# Patient Record
Sex: Male | Born: 1975 | Race: White | Hispanic: No | Marital: Single | Smoking: Current every day smoker
Health system: Southern US, Community
[De-identification: ages and names within clinical notes are randomized; demographics above are authoritative.]

## PROBLEM LIST (undated history)

## (undated) DIAGNOSIS — M109 Gout, unspecified: Secondary | ICD-10-CM

## (undated) DIAGNOSIS — I10 Essential (primary) hypertension: Secondary | ICD-10-CM

## (undated) HISTORY — DX: Essential (primary) hypertension: I10

## (undated) HISTORY — PX: ANTERIOR CRUCIATE LIGAMENT REPAIR: SHX115

## (undated) HISTORY — DX: Gout, unspecified: M10.9

---

## 2019-03-17 ENCOUNTER — Ambulatory Visit (INDEPENDENT_AMBULATORY_CARE_PROVIDER_SITE_OTHER): Payer: BC Managed Care – PPO

## 2019-03-17 ENCOUNTER — Ambulatory Visit (INDEPENDENT_AMBULATORY_CARE_PROVIDER_SITE_OTHER): Payer: BC Managed Care – PPO | Admitting: Osteopathic Medicine

## 2019-03-17 ENCOUNTER — Encounter: Payer: Self-pay | Admitting: Osteopathic Medicine

## 2019-03-17 ENCOUNTER — Other Ambulatory Visit: Payer: Self-pay

## 2019-03-17 VITALS — BP 143/90 | HR 96 | Temp 98.3°F | Ht 74.0 in | Wt 262.2 lb

## 2019-03-17 DIAGNOSIS — F17200 Nicotine dependence, unspecified, uncomplicated: Secondary | ICD-10-CM

## 2019-03-17 DIAGNOSIS — M79674 Pain in right toe(s): Secondary | ICD-10-CM | POA: Diagnosis not present

## 2019-03-17 DIAGNOSIS — Z8639 Personal history of other endocrine, nutritional and metabolic disease: Secondary | ICD-10-CM | POA: Insufficient documentation

## 2019-03-17 DIAGNOSIS — Z23 Encounter for immunization: Secondary | ICD-10-CM | POA: Diagnosis not present

## 2019-03-17 DIAGNOSIS — M109 Gout, unspecified: Secondary | ICD-10-CM

## 2019-03-17 DIAGNOSIS — I10 Essential (primary) hypertension: Secondary | ICD-10-CM | POA: Insufficient documentation

## 2019-03-17 DIAGNOSIS — L989 Disorder of the skin and subcutaneous tissue, unspecified: Secondary | ICD-10-CM | POA: Insufficient documentation

## 2019-03-17 MED ORDER — COLCHICINE 0.6 MG PO TABS: 0.6000 mg | ORAL_TABLET | Freq: Every day | ORAL | 0 refills | Status: DC

## 2019-03-17 MED ORDER — ALLOPURINOL 300 MG PO TABS: 300.0000 mg | ORAL_TABLET | Freq: Every day | ORAL | 3 refills | Status: DC

## 2019-03-17 MED ORDER — NICOTINE 7 MG/24HR TD PT24: 7.0000 mg | MEDICATED_PATCH | Freq: Every day | TRANSDERMAL | 0 refills | Status: AC

## 2019-03-17 MED ORDER — VALSARTAN 40 MG PO TABS: 40.0000 mg | ORAL_TABLET | Freq: Every day | ORAL | 1 refills | Status: DC

## 2019-03-17 MED ORDER — NICOTINE 14 MG/24HR TD PT24: 14.0000 mg | MEDICATED_PATCH | Freq: Every day | TRANSDERMAL | 0 refills | Status: AC

## 2019-03-17 MED ORDER — NICOTINE 21 MG/24HR TD PT24: 21.0000 mg | MEDICATED_PATCH | Freq: Every day | TRANSDERMAL | 0 refills | Status: AC

## 2019-03-17 NOTE — Progress Notes (Signed)
HPI: Gabriel Benton is a 43 y.o. male who  has a past medical history of Gout and Hypertension.  he presents to [facility] today, 03/17/19,  for chief complaint of: New to establish  Gout  HTN Skin check  Toe injury   Off all Rx meds for about 4 mos  Was on something for BP can't remember what Rx.   Off allopurinol for about 4 mos  Was also on something prn gout flare (?colchicine?)   Injury R 5th toe, concerned about break, still sore, stubbed it a month ago. R lateral foot also painful.   Concern about mole on face. Seems to be getting larger.   No FH colon cancer or prostate cancer, declines flu shots, updated Tdap today. Cisgender heterosexual male, declines STI screening, monogamous.    Past medical, surgical, social and family history reviewed:  Patient Active Problem List   Diagnosis Date Noted  . Gout 03/17/2019  . Essential hypertension 03/17/2019  . Smoker 03/17/2019  . Toe pain, right 03/17/2019  . Skin lesion 03/17/2019  . History of elevated glucose 03/17/2019    Past Surgical History:  Procedure Laterality Date  . ANTERIOR CRUCIATE LIGAMENT REPAIR      Social History   Tobacco Use  . Smoking status: Current Every Day Smoker    Packs/day: 1.00    Years: 20.00    Pack years: 20.00    Types: Cigarettes  . Smokeless tobacco: Never Used  Substance Use Topics  . Alcohol use: Yes    Alcohol/week: 6.0 standard drinks    Types: 6 Standard drinks or equivalent per week    Family History  Problem Relation Age of Onset  . Kidney cancer Mother   . High blood pressure Father   . Diabetes Father      Current medication list and allergy/intolerance information reviewed:    Current Outpatient Medications  Medication Sig Dispense Refill  . allopurinol (ZYLOPRIM) 300 MG tablet Take 1 tablet (300 mg total) by mouth daily. 90 tablet 3  . colchicine 0.6 MG tablet Take 1 tablet (0.6 mg total) by mouth daily. 90 tablet 0  .  nicotine (NICODERM CQ - DOSED IN MG/24 HOURS) 14 mg/24hr patch Place 1 patch (14 mg total) onto the skin daily. 28 patch 0  . nicotine (NICODERM CQ - DOSED IN MG/24 HOURS) 21 mg/24hr patch Place 1 patch (21 mg total) onto the skin daily. 28 patch 0  . nicotine (NICODERM CQ - DOSED IN MG/24 HR) 7 mg/24hr patch Place 1 patch (7 mg total) onto the skin daily. 28 patch 0  . valsartan (DIOVAN) 40 MG tablet Take 1 tablet (40 mg total) by mouth daily. 30 tablet 1   No current facility-administered medications for this visit.     No Known Allergies    Review of Systems:  Constitutional:  No  fever, no chills, No recent illness, No unintentional weight changes. No significant fatigue.   HEENT: No  headache, no vision change, no hearing change, No sore throat, No  sinus pressure  Cardiac: No  chest pain, No  pressure, No palpitations, No  Orthopnea  Respiratory:  No  shortness of breath. No  Cough  Gastrointestinal: No  abdominal pain, No  nausea, No  vomiting,  No  blood in stool, No  diarrhea, No  constipation   Musculoskeletal: +new myalgia/arthralgia  Skin: No  Rash, +other wounds/concerning lesions  Genitourinary: No  incontinence, No  abnormal genital bleeding, No abnormal  genital discharge  Hem/Onc: No  easy bruising/bleeding, No  abnormal lymph node  Endocrine: No cold intolerance,  No heat intolerance. No polyuria/polydipsia/polyphagia   Neurologic: No  weakness, No  dizziness, No  slurred speech/focal weakness/facial droop  Psychiatric: No  concerns with depression, No  concerns with anxiety, No sleep problems, No mood problems  Exam:  BP (!) 143/90 (BP Location: Left Arm, Patient Position: Sitting, Cuff Size: Large)   Pulse 96   Temp 98.3 F (36.8 C) (Oral)   Ht  (1.88 m)   Wt 262 lb 3.2 oz (118.9 kg)   BMI 33.66 kg/m 0  Constitutional: VS see above. General Appearance: alert, well-developed, well-nourished, NAD  Eyes: Normal lids and conjunctive, non-icteric  sclera  Ears, Nose, Mouth, Throat: MMM, Normal external inspection ears/nares/mouth/lips/gums. TM normal bilaterally. Pharynx/tonsils no erythema, no exudate. Nasal mucosa normal.   Neck: No masses, trachea midline. No thyroid enlargement. No tenderness/mass appreciated. No lymphadenopathy  Respiratory: Normal respiratory effort. no wheeze, no rhonchi, no rales  Cardiovascular: S1/S2 normal, no murmur, no rub/gallop auscultated. RRR. No lower extremity edema.  Gastrointestinal: Nontender, no masses. No hepatomegaly, no splenomegaly. No hernia appreciated. Bowel sounds normal. Rectal exam deferred.   Musculoskeletal: Gait normal. No clubbing/cyanosis of digits.  Right fifth toe appears a bit swollen, no ecchymoses or edema.  Neurological: Normal balance/coordination. No tremor. No cranial nerve deficit on limited exam. Motor and sensation intact and symmetric. Cerebellar reflexes intact.   Skin: warm, dry, intact. No rash/ulcer.  Several seborrheic keratoses on the face, one on right side of the lower face cauliflower-like appearance, at least a centimeter diameter, no significant discoloration  Psychiatric: Normal judgment/insight. Normal mood and affect. Oriented x3.    No results found for this or any previous visit (from the past 72 hour(s)).  No results found.   ASSESSMENT/PLAN: The primary encounter diagnosis was Essential hypertension. Diagnoses of Need for Tdap vaccination, Gout, unspecified cause, unspecified chronicity, unspecified site, Smoker, Toe pain, right, Skin lesion, and History of elevated glucose were also pertinent to this visit.   Orders Placed This Encounter  Procedures  . DG Foot Complete Right  . Tdap vaccine greater than or equal to 7yo IM  . CBC  . COMPLETE METABOLIC PANEL WITH GFR  . Lipid panel  . Hemoglobin A1c  . Uric acid  . Ambulatory referral to Dermatology    Meds ordered this encounter  Medications  . allopurinol (ZYLOPRIM) 300 MG tablet     Sig: Take 1 tablet (300 mg total) by mouth daily.    Dispense:  90 tablet    Refill:  3  . colchicine 0.6 MG tablet    Sig: Take 1 tablet (0.6 mg total) by mouth daily.    Dispense:  90 tablet    Refill:  0  . valsartan (DIOVAN) 40 MG tablet    Sig: Take 1 tablet (40 mg total) by mouth daily.    Dispense:  30 tablet    Refill:  1  . nicotine (NICODERM CQ - DOSED IN MG/24 HR) 7 mg/24hr patch    Sig: Place 1 patch (7 mg total) onto the skin daily.    Dispense:  28 patch    Refill:  0  . nicotine (NICODERM CQ - DOSED IN MG/24 HOURS) 14 mg/24hr patch    Sig: Place 1 patch (14 mg total) onto the skin daily.    Dispense:  28 patch    Refill:  0  . nicotine (NICODERM CQ -  DOSED IN MG/24 HOURS) 21 mg/24hr patch    Sig: Place 1 patch (21 mg total) onto the skin daily.    Dispense:  28 patch    Refill:  0    Patient Instructions  Plan:  Labs today.   X-ray of the foot today, will run it by sports medicine to see if anything else needs to be done about it or if you might need to see a podiatrist.  We will send referral to dermatologist, the spot of skin on the face appears benign to me but if we wanted this removed a dermatologist would probably be the best person to do it.  We will restart medications for blood pressure and for gout.  Also send a prescription for nicotine patches.  See below for other information on quitting smoking.  Start with a higher dose patch and decreased to the lowest dose patch and then discontinue patches, should be off of cigarettes by that point!        Steps to Quit Smoking  Smoking tobacco can be harmful to your health and can affect almost every organ in your body. Smoking puts you, and those around you, at risk for developing many serious chronic diseases. Quitting smoking is difficult, but it is one of the best things that you can do for your health. It is never too late to quit. What are the benefits of quitting smoking? When you quit  smoking, you lower your risk of developing serious diseases and conditions, such as:  Lung cancer or lung disease, such as COPD.  Heart disease.  Stroke.  Heart attack.  Infertility.  Osteoporosis and bone fractures. Additionally, symptoms such as coughing, wheezing, and shortness of breath may get better when you quit. You may also find that you get sick less often because your body is stronger at fighting off colds and infections. If you are pregnant, quitting smoking can help to reduce your chances of having a baby of low birth weight. How do I get ready to quit? When you decide to quit smoking, create a plan to make sure that you are successful. Before you quit:  Pick a date to quit. Set a date within the next two weeks to give you time to prepare.  Write down the reasons why you are quitting. Keep this list in places where you will see it often, such as on your bathroom mirror or in your car or wallet.  Identify the people, places, things, and activities that make you want to smoke (triggers) and avoid them. Make sure to take these actions: ? Throw away all cigarettes at home, at work, and in your car. ? Throw away smoking accessories, such as Set designerashtrays and lighters. ? Clean your car and make sure to empty the ashtray. ? Clean your home, including curtains and carpets.  Tell your family, friends, and coworkers that you are quitting. Support from your loved ones can make quitting easier.  Talk with your health care provider about your options for quitting smoking.  Find out what treatment options are covered by your health insurance. What strategies can I use to quit smoking? Talk with your healthcare provider about different strategies to quit smoking. Some strategies include:  Quitting smoking altogether instead of gradually lessening how much you smoke over a period of time. Research shows that quitting "cold [place]" is more successful than gradually quitting.  Attending  in-person counseling to help you build problem-solving skills. You are more likely to have success in  quitting if you attend several counseling sessions. Even short sessions of 10 minutes can be effective.  Finding resources and support systems that can help you to quit smoking and remain smoke-free after you quit. These resources are most helpful when you use them often. They can include: ? Online chats with a Social worker. ? Telephone quitlines. ? Careers information officer. ? Support groups or group counseling. ? Text messaging programs. ? Mobile phone applications.  Taking medicines to help you quit smoking. (If you are pregnant or breastfeeding, talk with your health care provider first.) Some medicines contain nicotine and some do not. Both types of medicines help with cravings, but the medicines that include nicotine help to relieve withdrawal symptoms. Your health care provider may recommend: ? Nicotine patches, gum, or lozenges. ? Nicotine inhalers or sprays. ? Non-nicotine medicine that is taken by mouth. Talk with your health care provider about combining strategies, such as taking medicines while you are also receiving in-person counseling. Using these two strategies together makes you more likely to succeed in quitting than if you used either strategy on its own. If you are pregnant or breastfeeding, talk with your health care provider about finding counseling or other support strategies to quit smoking. Do not take medicine to help you quit smoking unless told to do so by your health care provider. What things can I do to make it easier to quit? Quitting smoking might feel overwhelming at first, but there is a lot that you can do to make it easier. Take these important actions:  Reach out to your family and friends and ask that they support and encourage you during this time. Call telephone quitlines, reach out to support groups, or work with a counselor for support.  Ask people who  smoke to avoid smoking around you.  Avoid places that trigger you to smoke, such as bars, parties, or smoke-break areas at work.  Spend time around people who do not smoke.  Lessen stress in your life, because stress can be a smoking trigger for some people. To lessen stress, try: ? Exercising regularly. ? Deep-breathing exercises. ? Yoga. ? Meditating. ? Performing a body scan. This involves closing your eyes, scanning your body from head to toe, and noticing which parts of your body are particularly tense. Purposefully relax the muscles in those areas.  Download or purchase mobile phone or tablet apps (applications) that can help you stick to your quit plan by providing reminders, tips, and encouragement. There are many free apps, such as QuitGuide from the [employer] Office manager for Disease Control and Prevention). You can find other support for quitting smoking (smoking cessation) through smokefree.gov and other websites. How will I feel when I quit smoking? Within the first 24 hours of quitting smoking, you may start to feel some withdrawal symptoms. These symptoms are usually most noticeable 2-3 days after quitting, but they usually do not last beyond 2-3 weeks. Changes or symptoms that you might experience include:  Mood swings.  Restlessness, anxiety, or irritation.  Difficulty concentrating.  Dizziness.  Strong cravings for sugary foods in addition to nicotine.  Mild weight gain.  Constipation.  Nausea.  Coughing or a sore throat.  Changes in how your medicines work in your body.  A depressed mood.  Difficulty sleeping (insomnia). After the first 2-3 weeks of quitting, you may start to notice more positive results, such as:  Improved sense of smell and taste.  Decreased coughing and sore throat.  Slower heart rate.  Lower blood  pressure.  Clearer skin.  The ability to breathe more easily.  Fewer sick days. Quitting smoking is very challenging for most people. Do  not get discouraged if you are not successful the first time. Some people need to make many attempts to quit before they achieve long-term success. Do your best to stick to your quit plan, and talk with your health care provider if you have any questions or concerns. This information is not intended to replace advice given to you by your health care provider. Make sure you discuss any questions you have with your health care provider. Document Released: 09/18/2001 Document Revised: 04/30/2017 Document Reviewed: 02/08/2015 Elsevier Interactive Patient Education  2019 [employer]            Visit summary with medication list and pertinent instructions was printed for patient to review. All questions at time of visit were answered - patient instructed to contact office with any additional concerns or updates. ER/RTC precautions were reviewed with the patient.     Please note: voice recognition software was used to produce this document, and typos may escape review. Please contact Dr. Lyn HollingsheadAlexander for any needed clarifications.     Follow-up plan: Return in about 2 weeks (around 03/31/2019) for blood pressure recheck on new medications, review lab results.  .Marland Kitchen

## 2019-03-17 NOTE — Patient Instructions (Addendum)
Plan:  Labs today.   X-ray of the foot today, will run it by sports medicine to see if anything else needs to be done about it or if you might need to see a podiatrist.  We will send referral to dermatologist, the spot of skin on the face appears benign to me but if we wanted this removed a dermatologist would probably be the best person to do it.  We will restart medications for blood pressure and for gout.  Also send a prescription for nicotine patches.  See below for other information on quitting smoking.  Start with a higher dose patch and decreased to the lowest dose patch and then discontinue patches, should be off of cigarettes by that point!        Steps to Quit Smoking  Smoking tobacco can be harmful to your health and can affect almost every organ in your body. Smoking puts you, and those around you, at risk for developing many serious chronic diseases. Quitting smoking is difficult, but it is one of the best things that you can do for your health. It is never too late to quit. What are the benefits of quitting smoking? When you quit smoking, you lower your risk of developing serious diseases and conditions, such as:  Lung cancer or lung disease, such as COPD.  Heart disease.  Stroke.  Heart attack.  Infertility.  Osteoporosis and bone fractures. Additionally, symptoms such as coughing, wheezing, and shortness of breath may get better when you quit. You may also find that you get sick less often because your body is stronger at fighting off colds and infections. If you are pregnant, quitting smoking can help to reduce your chances of having a baby of low birth weight. How do I get ready to quit? When you decide to quit smoking, create a plan to make sure that you are successful. Before you quit:  Pick a date to quit. Set a date within the next two weeks to give you time to prepare.  Write down the reasons why you are quitting. Keep this list in places where you will  see it often, such as on your bathroom mirror or in your car or wallet.  Identify the people, places, things, and activities that make you want to smoke (triggers) and avoid them. Make sure to take these actions: ? Throw away all cigarettes at home, at work, and in your car. ? Throw away smoking accessories, such as Scientist, research (medical). ? Clean your car and make sure to empty the ashtray. ? Clean your home, including curtains and carpets.  Tell your family, friends, and coworkers that you are quitting. Support from your loved ones can make quitting easier.  Talk with your health care provider about your options for quitting smoking.  Find out what treatment options are covered by your health insurance. What strategies can I use to quit smoking? Talk with your healthcare provider about different strategies to quit smoking. Some strategies include:  Quitting smoking altogether instead of gradually lessening how much you smoke over a period of time. Research shows that quitting "cold [place]" is more successful than gradually quitting.  Attending in-person counseling to help you build problem-solving skills. You are more likely to have success in quitting if you attend several counseling sessions. Even short sessions of 10 minutes can be effective.  Finding resources and support systems that can help you to quit smoking and remain smoke-free after you quit. These resources are most helpful when  you use them often. They can include: ? Online chats with a Veterinary surgeoncounselor. ? Telephone quitlines. ? Automotive engineerrinted self-help materials. ? Support groups or group counseling. ? Text messaging programs. ? Mobile phone applications.  Taking medicines to help you quit smoking. (If you are pregnant or breastfeeding, talk with your health care provider first.) Some medicines contain nicotine and some do not. Both types of medicines help with cravings, but the medicines that include nicotine help to relieve withdrawal  symptoms. Your health care provider may recommend: ? Nicotine patches, gum, or lozenges. ? Nicotine inhalers or sprays. ? Non-nicotine medicine that is taken by mouth. Talk with your health care provider about combining strategies, such as taking medicines while you are also receiving in-person counseling. Using these two strategies together makes you more likely to succeed in quitting than if you used either strategy on its own. If you are pregnant or breastfeeding, talk with your health care provider about finding counseling or other support strategies to quit smoking. Do not take medicine to help you quit smoking unless told to do so by your health care provider. What things can I do to make it easier to quit? Quitting smoking might feel overwhelming at first, but there is a lot that you can do to make it easier. Take these important actions:  Reach out to your family and friends and ask that they support and encourage you during this time. Call telephone quitlines, reach out to support groups, or work with a counselor for support.  Ask people who smoke to avoid smoking around you.  Avoid places that trigger you to smoke, such as bars, parties, or smoke-break areas at work.  Spend time around people who do not smoke.  Lessen stress in your life, because stress can be a smoking trigger for some people. To lessen stress, try: ? Exercising regularly. ? Deep-breathing exercises. ? Yoga. ? Meditating. ? Performing a body scan. This involves closing your eyes, scanning your body from head to toe, and noticing which parts of your body are particularly tense. Purposefully relax the muscles in those areas.  Download or purchase mobile phone or tablet apps (applications) that can help you stick to your quit plan by providing reminders, tips, and encouragement. There are many free apps, such as QuitGuide from the [employer] Systems developer(Centers for Disease Control and Prevention). You can find other support for  quitting smoking (smoking cessation) through smokefree.gov and other websites. How will I feel when I quit smoking? Within the first 24 hours of quitting smoking, you may start to feel some withdrawal symptoms. These symptoms are usually most noticeable 2-3 days after quitting, but they usually do not last beyond 2-3 weeks. Changes or symptoms that you might experience include:  Mood swings.  Restlessness, anxiety, or irritation.  Difficulty concentrating.  Dizziness.  Strong cravings for sugary foods in addition to nicotine.  Mild weight gain.  Constipation.  Nausea.  Coughing or a sore throat.  Changes in how your medicines work in your body.  A depressed mood.  Difficulty sleeping (insomnia). After the first 2-3 weeks of quitting, you may start to notice more positive results, such as:  Improved sense of smell and taste.  Decreased coughing and sore throat.  Slower heart rate.  Lower blood pressure.  Clearer skin.  The ability to breathe more easily.  Fewer sick days. Quitting smoking is very challenging for most people. Do not get discouraged if you are not successful the first time. Some people need to make  many attempts to quit before they achieve long-term success. Do your best to stick to your quit plan, and talk with your health care provider if you have any questions or concerns. This information is not intended to replace advice given to you by your health care provider. Make sure you discuss any questions you have with your health care provider. Document Released: 09/18/2001 Document Revised: 04/30/2017 Document Reviewed: 02/08/2015 Elsevier Interactive Patient Education  2019 [employer]

## 2019-03-18 LAB — HEMOGLOBIN A1C
Hgb A1c MFr Bld: 5.2 % of total Hgb (ref <5.7)
Mean Plasma Glucose: 103 (calc)
eAG (mmol/L): 5.7 (calc)

## 2019-03-18 LAB — CBC
HCT: 51.6 % — ABNORMAL HIGH (ref 38.5–50.0)
Hemoglobin: 17.5 g/dL — ABNORMAL HIGH (ref 13.2–17.1)
MCH: 33 pg (ref 27.0–33.0)
MCHC: 33.9 g/dL (ref 32.0–36.0)
MCV: 97.2 fL (ref 80.0–100.0)
MPV: 10.6 fL (ref 7.5–12.5)
Platelets: 223 10*3/uL (ref 140–400)
RBC: 5.31 10*6/uL (ref 4.20–5.80)
RDW: 13.4 % (ref 11.0–15.0)
WBC: 9.7 10*3/uL (ref 3.8–10.8)

## 2019-03-18 LAB — COMPLETE METABOLIC PANEL WITH GFR
AG Ratio: 1.7 (calc) (ref 1.0–2.5)
ALT: 17 U/L (ref 9–46)
AST: 16 U/L (ref 10–40)
Albumin: 4.2 g/dL (ref 3.6–5.1)
Alkaline phosphatase (APISO): 73 U/L (ref 36–130)
BUN: 10 mg/dL (ref 7–25)
CO2: 26 mmol/L (ref 20–32)
Calcium: 9.2 mg/dL (ref 8.6–10.3)
Chloride: 106 mmol/L (ref 98–110)
Creat: 0.78 mg/dL (ref 0.60–1.35)
GFR, Est African American: 128 mL/min/{1.73_m2} (ref >=60)
GFR, Est Non African American: 111 mL/min/{1.73_m2} (ref >=60)
Globulin: 2.5 g/dL (calc) (ref 1.9–3.7)
Glucose, Bld: 107 mg/dL — ABNORMAL HIGH (ref 65–99)
Potassium: 4.8 mmol/L (ref 3.5–5.3)
Sodium: 141 mmol/L (ref 135–146)
Total Bilirubin: 0.5 mg/dL (ref 0.2–1.2)
Total Protein: 6.7 g/dL (ref 6.1–8.1)

## 2019-03-18 LAB — LIPID PANEL
Cholesterol: 218 mg/dL — ABNORMAL HIGH (ref <200)
HDL: 49 mg/dL (ref >=40)
LDL Cholesterol (Calc): 137 mg/dL (calc) — ABNORMAL HIGH
Non-HDL Cholesterol (Calc): 169 mg/dL (calc) — ABNORMAL HIGH (ref <130)
Total CHOL/HDL Ratio: 4.4 (calc) (ref <5.0)
Triglycerides: 180 mg/dL — ABNORMAL HIGH (ref <150)

## 2019-03-18 LAB — URIC ACID: Uric Acid, Serum: 8.2 mg/dL — ABNORMAL HIGH (ref 4.0–8.0)

## 2019-03-31 ENCOUNTER — Encounter: Payer: Self-pay | Admitting: Osteopathic Medicine

## 2019-03-31 ENCOUNTER — Ambulatory Visit (INDEPENDENT_AMBULATORY_CARE_PROVIDER_SITE_OTHER): Payer: BC Managed Care – PPO | Admitting: Osteopathic Medicine

## 2019-03-31 VITALS — BP 136/88 | HR 94 | Temp 98.5°F | Wt 269.6 lb

## 2019-03-31 DIAGNOSIS — M109 Gout, unspecified: Secondary | ICD-10-CM

## 2019-03-31 DIAGNOSIS — I10 Essential (primary) hypertension: Secondary | ICD-10-CM | POA: Diagnosis not present

## 2019-03-31 MED ORDER — VALSARTAN 40 MG PO TABS: 40.0000 mg | ORAL_TABLET | Freq: Every day | ORAL | 1 refills | Status: DC

## 2019-03-31 NOTE — Progress Notes (Signed)
HPI: Gabriel Benton is a 43 y.o. male who  has a past medical history of Gout and Hypertension.  [facility], 03/31/19,  for chief complaint of:  Recheck BP   Seen in office 03/17/19 (2 weeks ago) to establish care.  Doing well on new meds.  Spent some time reviewing recent lab results.    BP Readings from Last 3 Encounters:  03/31/19 136/88  03/17/19 (!) 143/90     Wt Readings from Last 3 Encounters:  03/31/19 269 lb 9.6 oz (122.3 kg)  03/17/19 262 lb 3.2 oz (118.9 kg)     At today's visit 03/31/19 ... PMH, PSH, FH reviewed and updated as needed.  Current medication list and allergy/intolerance hx reviewed and updated as needed. (See remainder of HPI, ROS, Phys Exam below)   No results found.  No results found for this or any previous visit (from the past 72 hour(s)).        ASSESSMENT/PLAN: The primary encounter diagnosis was Essential hypertension. A diagnosis of Gout, unspecified cause, unspecified chronicity, unspecified site was also pertinent to this visit.   Work on [employer], low salt diet, I think ok to leave meds for now but would increase to get to goal 130/80 is lifestyle measures aren't getting us there. Pt will be checking BP at home.   The 10-year ASCVD risk score Denman George(Goff [place] Montez HagemanJr., et al., 2013) is: 7.3%   Values used to calculate the score:     Age: [age] years     Sex: Male     Is Non-Hispanic African American: No     Diabetic: No     Tobacco smoker: Yes     Systolic Blood Pressure: 136 mmHg     Is BP treated: Yes     HDL Cholesterol: 49 mg/dL     Total Cholesterol: 218 mg/dL  Orders Placed This Encounter  Procedures  . BASIC METABOLIC PANEL WITH GFR  . Uric acid     Meds ordered this encounter  Medications  . valsartan (DIOVAN) 40 MG tablet    Sig: Take 1 tablet (40 mg total) by mouth daily.    Dispense:  90 tablet    Refill:  1    Cancel 30 days thanks        Follow-up plan: Return  in about 3 months (around 07/01/2019) for BP recheck w/ Dr A, in the meantime lab visit only in about a month (04/30/19) .                                                 ################################################# ################################################# ################################################# #################################################    Current Meds  Medication Sig  . allopurinol (ZYLOPRIM) 300 MG tablet Take 1 tablet (300 mg total) by mouth daily.  . colchicine 0.6 MG tablet Take 1 tablet (0.6 mg total) by mouth daily.  Marland Kitchen. MINOCYCLINE HCL PO Take by mouth.  . nicotine (NICODERM CQ - DOSED IN MG/24 HOURS) 14 mg/24hr patch Place 1 patch (14 mg total) onto the skin daily.  . nicotine (NICODERM CQ - DOSED IN MG/24 HOURS) 21 mg/24hr patch Place 1 patch (21 mg total) onto the skin daily.  . nicotine (NICODERM CQ - DOSED IN MG/24 HR) 7 mg/24hr patch Place 1 patch (7 mg total) onto the skin daily.  . valsartan (DIOVAN) 40  MG tablet Take 1 tablet (40 mg total) by mouth daily.  . [DISCONTINUED] valsartan (DIOVAN) 40 MG tablet Take 1 tablet (40 mg total) by mouth daily.    Allergies  Allergen Reactions  . Naproxen Nausea Only       Review of Systems:  Constitutional: No recent illness  HEENT: No  headache, no vision change  Cardiac: No  chest pain, No  pressure, No palpitations  Respiratory:  No  shortness of breath. No  Cough  Musculoskeletal: No new myalgia/arthralgia, toe still hurts  Neurologic: No  weakness, No  Dizziness  Psychiatric: No  concerns with depression, No  concerns with anxiety  Exam:  BP 136/88 (BP Location: Left Arm, Patient Position: Sitting, Cuff Size: Large)   Pulse 94   Temp 98.5 F (36.9 C) (Oral)   Wt 269 lb 9.6 oz (122.3 kg)   BMI 34.61 kg/m   Constitutional: VS see above. General Appearance: alert, well-developed, well-nourished, NAD  Eyes: Normal lids and  conjunctive, non-icteric sclera  Ears, Nose, Mouth, Throat: MMM, Normal external inspection ears/nares/mouth/lips/gums.  Neck: No masses, trachea midline.   Respiratory: Normal respiratory effort.   Musculoskeletal: Gait normal. Symmetric and independent movement of all extremities  Neurological: Normal balance/coordination. No tremor.  Skin: warm, dry, intact.   Psychiatric: Normal judgment/insight. Normal mood and affect. Oriented x3.       Visit summary with medication list and pertinent instructions was printed for patient to review, patient was advised to alert [place] if any updates are needed. All questions at time of visit were answered - patient instructed to contact office with any additional concerns. ER/RTC precautions were reviewed with the patient and understanding verbalized.      Please note: voice recognition software was used to produce this document, and typos may escape review. Please contact Dr. Sheppard Coil for any needed clarifications.    Follow up plan: Return in about 3 months (around 07/01/2019) for BP recheck w/ Dr A, in the meantime lab visit only in about a month (04/30/19) .

## 2019-04-17 ENCOUNTER — Encounter: Payer: Self-pay | Admitting: Osteopathic Medicine

## 2019-04-20 ENCOUNTER — Other Ambulatory Visit: Payer: Self-pay | Admitting: Osteopathic Medicine

## 2019-04-20 MED ORDER — IRBESARTAN 75 MG PO TABS: 75.0000 mg | ORAL_TABLET | Freq: Every day | ORAL | 0 refills | Status: DC

## 2019-05-11 ENCOUNTER — Other Ambulatory Visit: Payer: Self-pay | Admitting: Osteopathic Medicine

## 2019-06-23 ENCOUNTER — Telehealth: Payer: Self-pay

## 2019-06-23 ENCOUNTER — Other Ambulatory Visit: Payer: Self-pay

## 2019-06-23 ENCOUNTER — Encounter: Payer: Self-pay | Admitting: Osteopathic Medicine

## 2019-06-23 MED ORDER — ALLOPURINOL 300 MG PO TABS: 300.0000 mg | ORAL_TABLET | Freq: Every day | ORAL | 1 refills | Status: DC

## 2019-06-23 MED ORDER — COLCHICINE 0.6 MG PO TABS: 0.6000 mg | ORAL_TABLET | Freq: Every day | ORAL | 1 refills | Status: DC

## 2019-06-23 MED ORDER — IRBESARTAN 75 MG PO TABS: 75.0000 mg | ORAL_TABLET | Freq: Every day | ORAL | 1 refills | Status: DC

## 2019-06-23 NOTE — Telephone Encounter (Signed)
Pt sent msg via mychart.  He wants his scripts transferred to [facility] on [address] mainly the Allopurinol. He also uploaded his new insurance card via [place] but I don't see it.. I sent him a message.

## 2019-06-23 NOTE — Telephone Encounter (Signed)
Thanks [place].

## 2019-06-23 NOTE — Telephone Encounter (Signed)
Task completed. Rxs cancelled at [place]. At pt's request remaining refills for irbersartan, allopurinol and colchicine [facility] [place]. Pt has been updated. No other inquiries during call.

## 2019-07-01 ENCOUNTER — Ambulatory Visit: Payer: BC Managed Care – PPO | Admitting: Osteopathic Medicine

## 2019-07-01 ENCOUNTER — Encounter: Payer: Self-pay | Admitting: Osteopathic Medicine

## 2019-07-15 ENCOUNTER — Encounter: Payer: Self-pay | Admitting: Osteopathic Medicine

## 2019-07-15 ENCOUNTER — Other Ambulatory Visit: Payer: Self-pay

## 2019-07-15 ENCOUNTER — Ambulatory Visit (INDEPENDENT_AMBULATORY_CARE_PROVIDER_SITE_OTHER): Payer: No Typology Code available for payment source | Admitting: Osteopathic Medicine

## 2019-07-15 VITALS — BP 124/85 | HR 102 | Temp 98.7°F | Wt 300.0 lb

## 2019-07-15 DIAGNOSIS — F17200 Nicotine dependence, unspecified, uncomplicated: Secondary | ICD-10-CM

## 2019-07-15 DIAGNOSIS — I10 Essential (primary) hypertension: Secondary | ICD-10-CM | POA: Diagnosis not present

## 2019-07-15 DIAGNOSIS — M109 Gout, unspecified: Secondary | ICD-10-CM

## 2019-07-15 DIAGNOSIS — S91209A Unspecified open wound of unspecified toe(s) with damage to nail, initial encounter: Secondary | ICD-10-CM

## 2019-07-15 MED ORDER — VALSARTAN 40 MG PO TABS: 40.0000 mg | ORAL_TABLET | Freq: Every day | ORAL | 1 refills | Status: DC

## 2019-07-15 NOTE — Patient Instructions (Addendum)
STOP irbesartan RESTART valsartan  Nurse visit - bring home BP monitor so we can verify accuracy!  Goal BP 130/80 or less!   Let me know if shoulder/neck becomes an issue again  Let me know if that toenail starts to look nasty!   Will check labs for gout levels, kidney function for medication safety

## 2019-07-15 NOTE — Progress Notes (Signed)
HPI: Gabriel Benton is a 43 y.o. male who  has a past medical history of Gout and Hypertension.  he presents to [facility] today, 07/15/19,  for chief complaint of:  Follow up BP Shoulder issue  Shoulder: See note 07/01/19, concern about shoulder pain, offered appt w/ sports but pt read message didn't call office to schedule.  Patient reports the issue is resolved at this point, he was having sharp pains in the left side behind/under the shoulder blade at base of neck, radiating into shoulder and was experiencing some numbness/tingling in the first and second digits of the left hand.  Thinks it was due to sleeping on a different mattress while on vacation.  Symptoms have not recurred.  BP: last in office visit 03/2019 we discussed BP, decided he would work on weight, low salt diet, "I think ok to leave meds for now but would increase to get to goal 130/80 is lifestyle measures aren't getting [place] there. Pt will be checking BP at home." Valsartan 40 mg was out at the pharmacy so I sent Irbesartan 75 mg.  He states that on the irbesartan, he has noticed a significant decrease in libido.  No chest pain, pressure, headache, dizziness.  He would like to try getting back on the valsartan, he did not have this effect with that medication.  He is checking blood pressures at home but is getting numbers systolic up into the 170s, blood pressure cuff has not been verified in the office  BP Readings from Last 3 Encounters:  07/15/19 (!) 146/91 on intake,  Recheck 124/85  03/31/19 136/88  03/17/19 (!) 143/90    Toenail left great toe, stubbed this and there is been a large portion of it but is just kind of hanging on, he has been nervous to pull it.    At today's visit 07/15/19 ... PMH, PSH, FH reviewed and updated as needed.  Current medication list and allergy/intolerance hx reviewed and updated as needed. (See remainder of HPI, ROS, Phys Exam below)   No results  found.  No results found for this or any previous visit (from the past 72 hour(s)).        ASSESSMENT/PLAN: The primary encounter diagnosis was Essential hypertension. Diagnoses of Gout, unspecified cause, unspecified chronicity, unspecified site, Smoker, and Toenail avulsion, initial encounter were also pertinent to this visit.   I remove the avulsed portion of toenail, I do not see any ingrowing/pain nail on that left side of the left toe, on the right side of that toenail there is some significant onychomycosis and slight ingrowing but no apparent infection, patient states that this does not bother him  No orders of the defined types were placed in this encounter.    Meds ordered this encounter  Medications  . valsartan (DIOVAN) 40 MG tablet    Sig: Take 1 tablet (40 mg total) by mouth daily.    Dispense:  90 tablet    Refill:  1    Patient Instructions  STOP irbesartan RESTART valsartan  Nurse visit - bring home BP monitor so we can verify accuracy!  Goal BP 130/80 or less!   Let me know if shoulder/neck becomes an issue again  Let me know if that toenail starts to look nasty!   Will check labs for gout levels, kidney function for medication safety         Follow-up plan: Return for nurse visit verify home BP monitor .                                                 ################################################# ################################################# ################################################# #################################################  Current Meds  Medication Sig  . allopurinol (ZYLOPRIM) 300 MG tablet Take 1 tablet (300 mg total) by mouth daily.  . irbesartan (AVAPRO) 75 MG tablet Take 1 tablet (75 mg total) by mouth daily.  Marland Kitchen MINOCYCLINE HCL PO Take by mouth.  . nicotine (NICODERM CQ - DOSED IN MG/24 HOURS) 14 mg/24hr patch Place 1 patch (14 mg total) onto the skin daily.  .  nicotine (NICODERM CQ - DOSED IN MG/24 HOURS) 21 mg/24hr patch Place 1 patch (21 mg total) onto the skin daily.  . nicotine (NICODERM CQ - DOSED IN MG/24 HR) 7 mg/24hr patch Place 1 patch (7 mg total) onto the skin daily.    Allergies  Allergen Reactions  . Naproxen Nausea Only       Review of Systems:  Constitutional: No recent illness  HEENT: No  headache, no vision change  Cardiac: No  chest pain, No  pressure, No palpitations  Respiratory:  No  shortness of breath. No  Cough  Gastrointestinal: No  abdominal pain,  Musculoskeletal: No new myalgia/arthralgia  Skin/nails: No  Rash, toenail issue as per HPI  Neurologic: No  weakness, No  Dizziness  Psychiatric: No  concerns with depression, No  concerns with anxiety  Exam:  BP (!) 146/91 (BP Location: Left Arm, Patient Position: Sitting, Cuff Size: Large)   Pulse 100   Temp 98.7 F (37.1 C) (Oral)   Wt 300 lb (136.1 kg)   BMI 38.52 kg/m   Constitutional: VS see above. General Appearance: alert, well-developed, well-nourished, NAD  Neck: No masses, trachea midline. Normal active/passive ROM to flex/ext, SB and rotation L/R  Respiratory: Normal respiratory effort. no wheeze, no rhonchi, no rales  Cardiovascular: S1/S2 normal, no murmur, no rub/gallop auscultated. RRR.   Musculoskeletal: Gait normal. Symmetric and independent movement of all extremities.  L: Negative Apley scratch test, negative drop arm, negative apprehension.  There is some muscle tension in left rhomboid area.  Spurling's test negative on left.  Neurological: Normal balance/coordination. No tremor.  Skin: warm, dry, intact. Nail on L 1st toe is avulsed on lateral aspect but no apparent inflammation   Psychiatric: Normal judgment/insight. Normal mood and affect. Oriented x3.       Visit summary with medication list and pertinent instructions was printed for patient to review, patient was advised to alert [place] if any updates are needed. All  questions at time of visit were answered - patient instructed to contact office with any additional concerns. ER/RTC precautions were reviewed with the patient and understanding verbalized.    Please note: voice recognition software was used to produce this document, and typos may escape review. Please contact Dr. Sheppard Coil for any needed clarifications.    Follow up plan: Return for nurse visit verify home BP monitor .

## 2019-07-16 LAB — URIC ACID: Uric Acid, Serum: 5.3 mg/dL (ref 4.0–8.0)

## 2019-07-16 LAB — BASIC METABOLIC PANEL WITH GFR
BUN: 16 mg/dL (ref 7–25)
CO2: 24 mmol/L (ref 20–32)
Calcium: 9.5 mg/dL (ref 8.6–10.3)
Chloride: 101 mmol/L (ref 98–110)
Creat: 0.83 mg/dL (ref 0.60–1.35)
GFR, Est African American: 125 mL/min/{1.73_m2} (ref >=60)
GFR, Est Non African American: 108 mL/min/{1.73_m2} (ref >=60)
Glucose, Bld: 121 mg/dL — ABNORMAL HIGH (ref 65–99)
Potassium: 4.4 mmol/L (ref 3.5–5.3)
Sodium: 133 mmol/L — ABNORMAL LOW (ref 135–146)

## 2019-07-17 ENCOUNTER — Other Ambulatory Visit: Payer: Self-pay | Admitting: Osteopathic Medicine

## 2019-07-17 DIAGNOSIS — E871 Hypo-osmolality and hyponatremia: Secondary | ICD-10-CM

## 2019-07-22 ENCOUNTER — Ambulatory Visit: Payer: No Typology Code available for payment source

## 2019-07-29 ENCOUNTER — Other Ambulatory Visit: Payer: Self-pay

## 2019-07-29 ENCOUNTER — Ambulatory Visit (INDEPENDENT_AMBULATORY_CARE_PROVIDER_SITE_OTHER): Payer: No Typology Code available for payment source | Admitting: Osteopathic Medicine

## 2019-07-29 VITALS — BP 136/86 | HR 88

## 2019-07-29 DIAGNOSIS — I10 Essential (primary) hypertension: Secondary | ICD-10-CM

## 2019-07-29 NOTE — Progress Notes (Signed)
Pt advised. Increasing dose and nurse visit scheduled for next week

## 2019-07-29 NOTE — Progress Notes (Signed)
Patient presents to office for blood pressure check and to verify home monitor.   Patient has stopped irbesartan and restarted valsartan since last visit.   Office machine read blood pressure as 136/86, patient's home monitor read 136/98. Patient was advised that since the diastolic reading was greater than 10 off, it was not accurate.  I advised patient to continue current medications and I would call with providers update.

## 2019-07-29 NOTE — Progress Notes (Signed)
Called pt, no answer and no vm set up.

## 2019-07-29 NOTE — Progress Notes (Signed)
Would agree we can't totally trust the home numbers. Valsartan is at a pretty low dose right now, we might be ok to just increase from 40 mg to 80 mg to get goal BP below 103 systolic, below 80 diastolic- and have him recheck w/ nurse again in one week.    BP Readings from Last 3 Encounters:  07/29/19 136/86  07/15/19 124/85  03/31/19 136/88

## 2019-08-06 ENCOUNTER — Ambulatory Visit: Payer: No Typology Code available for payment source

## 2019-08-12 ENCOUNTER — Ambulatory Visit: Payer: No Typology Code available for payment source

## 2019-08-12 ENCOUNTER — Other Ambulatory Visit: Payer: Self-pay

## 2019-08-19 ENCOUNTER — Other Ambulatory Visit: Payer: Self-pay

## 2019-08-19 ENCOUNTER — Ambulatory Visit (INDEPENDENT_AMBULATORY_CARE_PROVIDER_SITE_OTHER): Payer: No Typology Code available for payment source | Admitting: Osteopathic Medicine

## 2019-08-19 VITALS — BP 136/92 | HR 82 | Temp 98.2°F | Wt 312.0 lb

## 2019-08-19 DIAGNOSIS — I10 Essential (primary) hypertension: Secondary | ICD-10-CM | POA: Diagnosis not present

## 2019-08-19 NOTE — Progress Notes (Signed)
Patient in today for BP check. Patient reports he is taking valsartan 80 mg . BP in the office today is 136/92. Pt denies headaches, blurred vision, chest pain and shortness of breath. Pt is to continue with current medications, and to follow up in one month. Pt advised to keep log of BP.and to contact us if BP is not improving.

## 2019-08-24 ENCOUNTER — Encounter: Payer: Self-pay | Admitting: Osteopathic Medicine

## 2019-08-24 MED ORDER — VALSARTAN 80 MG PO TABS: 80.0000 mg | ORAL_TABLET | Freq: Every day | ORAL | 0 refills | Status: DC

## 2019-08-24 NOTE — Telephone Encounter (Signed)
Per nurse visits:  "Patient in today for BP check. Patient reports he is taking valsartan 80 mg ."  Pended RX for 80 mg dose, please approve and send if OK

## 2019-09-15 ENCOUNTER — Other Ambulatory Visit: Payer: Self-pay | Admitting: Osteopathic Medicine

## 2019-09-15 NOTE — Telephone Encounter (Signed)
Requested medication (s) are due for refill today: yes  Requested medication (s) are on the active medication list: yes  Last refill:  08/24/2019  Future visit scheduled: no  Notes to clinic:  Review for refill   Requested Prescriptions  Pending Prescriptions Disp Refills   valsartan (DIOVAN) 80 MG tablet [Pharmacy Med Name: VALSARTAN 80 MG TABLET] 30 tablet 2    Sig: TAKE 1 TABLET BY MOUTH EVERY DAY     Cardiovascular:  Angiotensin Receptor Blockers Failed - 09/15/2019  8:32 AM      Failed - Last BP in normal range    BP Readings from Last 1 Encounters:  08/19/19 (!) 136/92         Failed - Valid encounter within last 6 months    Recent Outpatient Visits          3 weeks ago Essential hypertension   [place], Lanelle Bal, DO   1 month ago Essential hypertension   [facility] Lanelle Bal, DO   2 months ago Essential hypertension   [place], Lanelle Bal, DO   5 months ago Essential hypertension   [place], Lanelle Bal, DO   6 months ago Essential hypertension   [place], Lanelle Bal, DO             Passed - Cr in normal range and within 180 days    Creat  Date Value Ref Range Status  07/15/2019 0.83 0.60 - 1.35 mg/dL Final         Passed - K in normal range and within 180 days    Potassium  Date Value Ref Range Status  07/15/2019 4.4 3.5 - 5.3 mmol/L Final         Passed - Patient is not pregnant

## 2019-09-17 ENCOUNTER — Encounter: Payer: Self-pay | Admitting: Osteopathic Medicine

## 2019-09-17 MED ORDER — VALSARTAN 80 MG PO TABS: 80.0000 mg | ORAL_TABLET | Freq: Every day | ORAL | 0 refills | Status: AC

## 2019-10-02 IMAGING — DX RIGHT FOOT COMPLETE - 3+ VIEW
3 series · 3 of 3 positions shown · non-contrast
Comparison: None

CLINICAL DATA: RIGHT fifth toe pain question fracture, struck fifth
toe about 4 months ago, still hurting

EXAM:
RIGHT FOOT COMPLETE - 3+ VIEW

[foot ap]
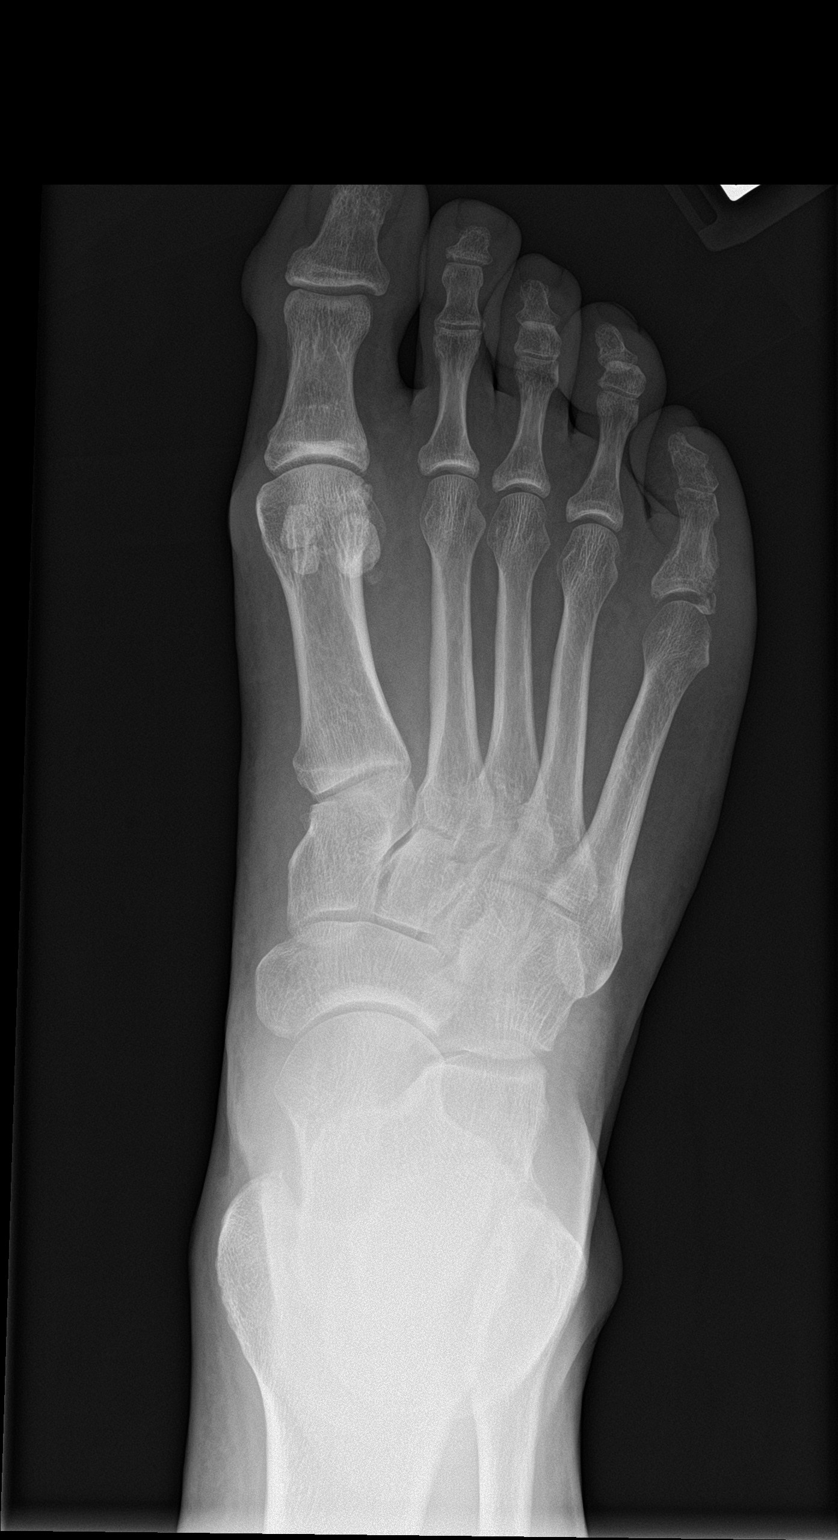

[foot obl]
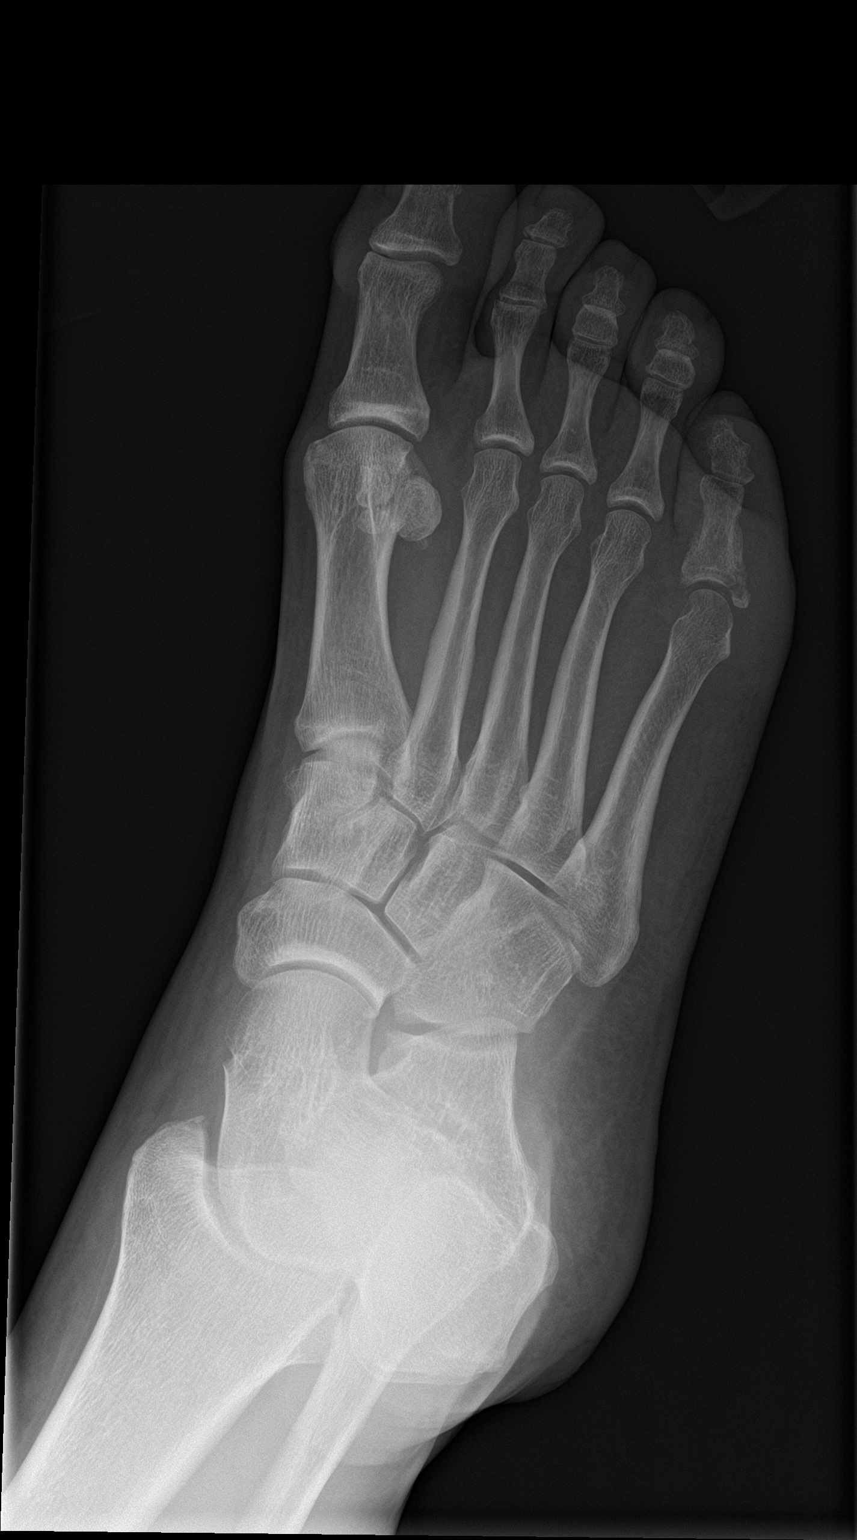

[foot lat]
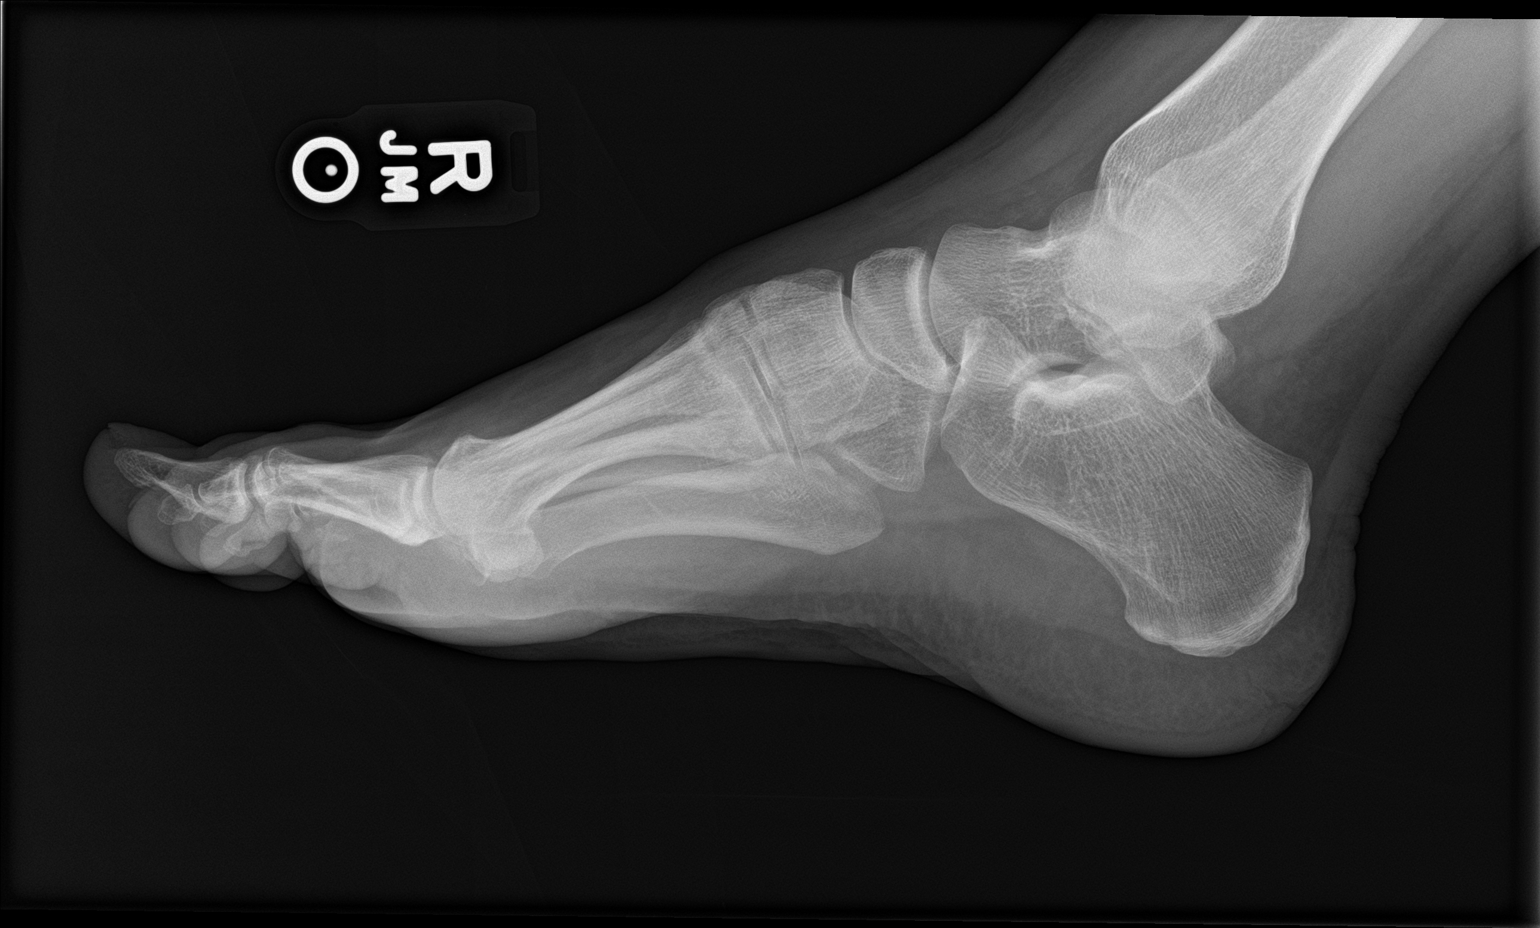

[3 of 3 positions shown; findings below may reference images not displayed]

FINDINGS: Osseous mineralization low normal.

Joint spaces preserved.

Intra-articular fracture identified at base of proximal phalanx
fifth toe, margins ill-defined consistent with subacute age.

Additional well-formed callus is seen at the shaft and base of the
proximal phalanx fifth toe.

No additional fracture, dislocation, or bone destruction.
IMPRESSION: Subacute mildly displaced intra-articular fracture at base of
proximal phalanx fifth toe.

## 2019-11-11 ENCOUNTER — Encounter: Payer: Self-pay | Admitting: Osteopathic Medicine

## 2019-11-11 ENCOUNTER — Other Ambulatory Visit: Payer: Self-pay

## 2019-11-11 MED ORDER — ALLOPURINOL 300 MG PO TABS: 300.0000 mg | ORAL_TABLET | Freq: Every day | ORAL | 0 refills | Status: AC
